# Patient Record
Sex: Female | Born: 1940 | Race: White | Hispanic: No | Marital: Married | Smoking: Never smoker
Health system: Southern US, Community
[De-identification: ages and names within clinical notes are randomized; demographics above are authoritative.]

## PROBLEM LIST (undated history)

## (undated) DIAGNOSIS — K219 Gastro-esophageal reflux disease without esophagitis: Secondary | ICD-10-CM

## (undated) DIAGNOSIS — I10 Essential (primary) hypertension: Secondary | ICD-10-CM

## (undated) DIAGNOSIS — M8580 Other specified disorders of bone density and structure, unspecified site: Secondary | ICD-10-CM

## (undated) DIAGNOSIS — M1990 Unspecified osteoarthritis, unspecified site: Secondary | ICD-10-CM

## (undated) HISTORY — DX: Essential (primary) hypertension: I10

## (undated) HISTORY — PX: APPENDECTOMY: SHX54

## (undated) HISTORY — DX: Gastro-esophageal reflux disease without esophagitis: K21.9

## (undated) HISTORY — DX: Unspecified osteoarthritis, unspecified site: M19.90

## (undated) HISTORY — DX: Other specified disorders of bone density and structure, unspecified site: M85.80

## (undated) HISTORY — PX: SHOULDER SURGERY: SHX246

## (undated) HISTORY — PX: WRIST SURGERY: SHX841

## (undated) HISTORY — PX: TONSILLECTOMY: SUR1361

---

## 2001-03-05 ENCOUNTER — Encounter: Payer: Self-pay | Admitting: Unknown Physician Specialty

## 2001-03-05 ENCOUNTER — Ambulatory Visit (HOSPITAL_COMMUNITY): Admission: RE | Admit: 2001-03-05 | Discharge: 2001-03-05 | Payer: Self-pay | Admitting: Unknown Physician Specialty

## 2017-04-26 DIAGNOSIS — Z01818 Encounter for other preprocedural examination: Secondary | ICD-10-CM | POA: Diagnosis not present

## 2018-10-01 DIAGNOSIS — R918 Other nonspecific abnormal finding of lung field: Secondary | ICD-10-CM

## 2018-10-18 ENCOUNTER — Institutional Professional Consult (permissible substitution): Payer: Medicare Other | Admitting: Emergency Medicine

## 2018-10-18 ENCOUNTER — Encounter: Payer: Self-pay | Admitting: *Deleted

## 2018-10-18 ENCOUNTER — Other Ambulatory Visit: Payer: Self-pay | Admitting: *Deleted

## 2018-10-18 ENCOUNTER — Telehealth: Payer: Self-pay | Admitting: Acute Care

## 2018-10-18 NOTE — Progress Notes (Signed)
Oncology Nurse Navigator Documentation  Oncology Nurse Navigator Flowsheets 10/18/2018  Navigator Location [facility]  Navigator Encounter Type Other/per cancer conference 10/18/2018, recommendation is for tissue DX with EBUS.  Patient is set up to see Dr. Lamonte Sakai 10/19/2018.    Barriers/Navigation Needs Coordination of Care  Interventions Coordination of Care  Coordination of Care Other  Acuity Level 1  Time Spent with Patient 15

## 2018-10-18 NOTE — Progress Notes (Signed)
The proposed treatment discussed at cancer conference 10/18/2018 is for discussion purpose only and is not a binding recommendation.  The patient was not physically examined nor present for their treatment options.  Therefore, final treatment plans cannot be decided.

## 2018-10-18 NOTE — Telephone Encounter (Signed)
I have spoken to Dr. Margarette Canada nurse  Ezra Sites at [facility]. Ms. Mcintire is Dr. Margarette Canada Primary care patient. This patient was presented at Thoracic conference this morning.She has PET scan suspicious for bronchogenic cancer with suspicion of metastatic disease. She was placed on the thoracic conference roster by Dr. Anselm Pancoast from IR. Recommendation of the group was for bronch/ EBUS for tissue sampling. PET was done in [facility] through [employer] I was asked  to make sure the patient had appropriate follow up. I called the patient's PCP to ask them to ensure the patient had appropriate oncology follow up and to let them know if the patient needed referral for the Bronch / Ebus we could provide that service if the patient was not already plugged in to a group who is managing her follow up care and diagnostic work up.  [place] stated that she will pass the information on to Dr. Gloriann Loan to ensure the patient had appropriate follow up/ tissue sampling.    10/04/2018 PET Imaging Base of Skull to Thigh 2.6 cm right lower lobe lesion is hypermetabolic and most consistent with primary lung neoplasm. 2. Multiple pulmonary nodules suspicious for metastatic disease. 3. Mediastinal and hilar lymph nodes. The only hypermetabolic lesion is the subcarinal node on the right side. 4. No findings for abdominal/pelvic metastatic disease or osseous metastatic disease.

## 2018-10-19 ENCOUNTER — Ambulatory Visit (INDEPENDENT_AMBULATORY_CARE_PROVIDER_SITE_OTHER): Payer: Medicare Other | Admitting: Emergency Medicine

## 2018-10-19 ENCOUNTER — Encounter: Payer: Self-pay | Admitting: Emergency Medicine

## 2018-10-19 VITALS — BP 140/82 | HR 82 | Ht 65.0 in | Wt <= 1120 oz

## 2018-10-19 DIAGNOSIS — R05 Cough: Secondary | ICD-10-CM

## 2018-10-19 DIAGNOSIS — R9389 Abnormal findings on diagnostic imaging of other specified body structures: Secondary | ICD-10-CM | POA: Diagnosis not present

## 2018-10-19 DIAGNOSIS — R918 Other nonspecific abnormal finding of lung field: Secondary | ICD-10-CM | POA: Diagnosis not present

## 2018-10-19 DIAGNOSIS — R059 Cough, unspecified: Secondary | ICD-10-CM

## 2018-10-19 NOTE — Assessment & Plan Note (Signed)
The cough actually prompted chest x-rays and then subsequent imaging.  It started after she underwent general anesthesia for shoulder surgery and had upper airway, throat irritation from the endotracheal tube.  She is continued to have cough, has been noted to have hiatal hernia (on a PPI) also on an ACE inhibitor.  May need to modify both of these going forward.

## 2018-10-19 NOTE — Assessment & Plan Note (Signed)
Bilateral pulmonary nodules most notable in the right with some evidence of hypermetabolism on PET scan.  She also has mediastinal lymphadenopathy with some mild hypermetabolism in the right subcarinal area.  At risk for primary lung cancer versus opportunistic infection.  She is a never smoker but does have a family history for lung malignancy.  I believe she needs diagnostics soon as we can arrange.  I recommended bronchoscopy with navigation to her pulmonary nodules, endobronchial ultrasound to approach the subcarinal node.  She understands the risks and benefits and agrees.  I will schedule soon as possible.

## 2018-10-19 NOTE — Patient Instructions (Addendum)
We will set up bronchoscopy under general anesthesia to better evaluate your pulmonary nodules and your enlarged lymph nodes in the chest.  We will get this done on either 3/11 or 3/18.  We will update you on the scheduling. We will repeat your CT scan of the chest to help facilitate the bronchoscopy. Continue your omeprazole 20 mg as you have been taking it. We may decide to consider changing your lisinopril at some point in the future as it can contribute to cough. Follow with Dr Lamonte Sakai in 1 month

## 2018-10-19 NOTE — H&P (View-Only) (Signed)
Subjective:    Patient ID: Erika Shah, female    DOB: 25-Dec-1940, 78 y.o.   MRN: [id number]  HPI  78 year old never smoker with a history of GERD, hypertension on an ACE inhibitor, osteoarthritis.  She was discussed at thoracic conference on 3/5 for an abnormal CT scan of the chest and PET scan.  She was initially referred by Dr. Elisabeth Cara to interventional radiology for possible biopsy. The imaging was done when she developed dry cough. No hemoptysis. She has some low level R pleuritic pain with cough. Has a hiatal hernia with GERD.   PET 10/04/2018 reviewed by me, shows a 2.6 cm right lower lobe hypermetabolic nodule, multiple other pulmonary nodules suspicious for metastatic disease, mediastinal and hilar lymphadenopathy.  The only node that is hypermetabolic is in the subcarinal region.  No evidence of distant metastatic disease.  Family hx lung CA in her dad (smoker)  Review of Systems  Constitutional: Negative for fever and unexpected weight change.  HENT: Negative for congestion, dental problem, ear pain, nosebleeds, postnasal drip, rhinorrhea, sinus pressure, sneezing, sore throat and trouble swallowing.   Eyes: Negative for redness and itching.  Respiratory: Positive for cough. Negative for chest tightness, shortness of breath and wheezing.   Cardiovascular: Negative for palpitations and leg swelling.  Gastrointestinal: Negative for nausea and vomiting.  Genitourinary: Negative for dysuria.  Musculoskeletal: Negative for joint swelling.  Skin: Negative for rash.  Neurological: Negative for headaches.  Hematological: Does not bruise/bleed easily.  Psychiatric/Behavioral: Negative for dysphoric mood. The patient is not nervous/anxious.    Past Medical History:  Diagnosis Date  . GERD (gastroesophageal reflux disease)   . Hypertension   . Osteoarthritis   . Osteopenia      No family history on file.   Social History   Socioeconomic History  . Marital status:  Married    Spouse name: Not on file  . Number of children: Not on file  . Years of education: Not on file  . Highest education level: Not on file  Occupational History  . Not on file  Social Needs  . Financial resource strain: Not on file  . Food insecurity:    Worry: Not on file    Inability: Not on file  . Transportation needs:    Medical: Not on file    Non-medical: Not on file  Tobacco Use  . Smoking status: Never Smoker  . Smokeless tobacco: Never Used  Substance and Sexual Activity  . Alcohol use: Not on file  . Drug use: Not on file  . Sexual activity: Not on file  Lifestyle  . Physical activity:    Days per week: Not on file    Minutes per session: Not on file  . Stress: Not on file  Relationships  . Social connections:    Talks on phone: Not on file    Gets together: Not on file    Attends religious service: Not on file    Active member of club or organization: Not on file    Attends meetings of clubs or organizations: Not on file    Relationship status: Not on file  . Intimate partner violence:    Fear of current or ex partner: Not on file    Emotionally abused: Not on file    Physically abused: Not on file    Forced sexual activity: Not on file  Other Topics Concern  . Not on file  Social History Narrative  . Not on file  No Known Allergies   Outpatient Medications Prior to Visit  Medication Sig Dispense Refill  . lisinopril-hydrochlorothiazide (PRINZIDE,ZESTORETIC) 10-12.5 MG tablet Take 1 tablet by mouth daily.    Marland Kitchen omeprazole (PRILOSEC) 20 MG capsule Take 20 mg by mouth daily.     No facility-administered medications prior to visit.         Objective:   Physical Exam Vitals:   10/19/18 1133  BP: 140/82  Pulse: 82  SpO2: 92%  Weight: 19 lb (8.618 kg)  Height: 5\' 5"  (1.651 m)   Gen: Pleasant, well-nourished, in no distress,  normal affect  ENT: No lesions,  mouth clear,  oropharynx clear, no postnasal drip, very decreased hearing   Neck: No JVD, no stridor  Lungs: No use of accessory muscles, no crackles or wheezing on normal respiration, no wheeze on forced expiration  Cardiovascular: RRR, heart sounds normal, no murmur or gallops, no peripheral edema  Musculoskeletal: No deformities, no cyanosis or clubbing  Neuro: alert, awake, non focal  Skin: Warm, no lesions or rash       Assessment & Plan:  Cough The cough actually prompted chest x-rays and then subsequent imaging.  It started after she underwent general anesthesia for shoulder surgery and had upper airway, throat irritation from the endotracheal tube.  She is continued to have cough, has been noted to have hiatal hernia (on a PPI) also on an ACE inhibitor.  May need to modify both of these going forward.  Abnormal CT of the chest Bilateral pulmonary nodules most notable in the right with some evidence of hypermetabolism on PET scan.  She also has mediastinal lymphadenopathy with some mild hypermetabolism in the right subcarinal area.  At risk for primary lung cancer versus opportunistic infection.  She is a never smoker but does have a family history for lung malignancy.  I believe she needs diagnostics soon as we can arrange.  I recommended bronchoscopy with navigation to her pulmonary nodules, endobronchial ultrasound to approach the subcarinal node.  She understands the risks and benefits and agrees.  I will schedule soon as possible.  Baltazar Apo, MD, PhD 10/19/2018, 1:14 PM [facility] [phone] or if no answer [phone]

## 2018-10-19 NOTE — Progress Notes (Signed)
Subjective:    Patient ID: Erika Shah, female    DOB: 1941-01-19, 78 y.o.   MRN: [id number]  HPI  78 year old never smoker with a history of GERD, hypertension on an ACE inhibitor, osteoarthritis.  She was discussed at thoracic conference on 3/5 for an abnormal CT scan of the chest and PET scan.  She was initially referred by Dr. Elisabeth Cara to interventional radiology for possible biopsy. The imaging was done when she developed dry cough. No hemoptysis. She has some low level R pleuritic pain with cough. Has a hiatal hernia with GERD.   PET 10/04/2018 reviewed by me, shows a 2.6 cm right lower lobe hypermetabolic nodule, multiple other pulmonary nodules suspicious for metastatic disease, mediastinal and hilar lymphadenopathy.  The only node that is hypermetabolic is in the subcarinal region.  No evidence of distant metastatic disease.  Family hx lung CA in her dad (smoker)  Review of Systems  Constitutional: Negative for fever and unexpected weight change.  HENT: Negative for congestion, dental problem, ear pain, nosebleeds, postnasal drip, rhinorrhea, sinus pressure, sneezing, sore throat and trouble swallowing.   Eyes: Negative for redness and itching.  Respiratory: Positive for cough. Negative for chest tightness, shortness of breath and wheezing.   Cardiovascular: Negative for palpitations and leg swelling.  Gastrointestinal: Negative for nausea and vomiting.  Genitourinary: Negative for dysuria.  Musculoskeletal: Negative for joint swelling.  Skin: Negative for rash.  Neurological: Negative for headaches.  Hematological: Does not bruise/bleed easily.  Psychiatric/Behavioral: Negative for dysphoric mood. The patient is not nervous/anxious.    Past Medical History:  Diagnosis Date  . GERD (gastroesophageal reflux disease)   . Hypertension   . Osteoarthritis   . Osteopenia      No family history on file.   Social History   Socioeconomic History  . Marital status:  Married    Spouse name: Not on file  . Number of children: Not on file  . Years of education: Not on file  . Highest education level: Not on file  Occupational History  . Not on file  Social Needs  . Financial resource strain: Not on file  . Food insecurity:    Worry: Not on file    Inability: Not on file  . Transportation needs:    Medical: Not on file    Non-medical: Not on file  Tobacco Use  . Smoking status: Never Smoker  . Smokeless tobacco: Never Used  Substance and Sexual Activity  . Alcohol use: Not on file  . Drug use: Not on file  . Sexual activity: Not on file  Lifestyle  . Physical activity:    Days per week: Not on file    Minutes per session: Not on file  . Stress: Not on file  Relationships  . Social connections:    Talks on phone: Not on file    Gets together: Not on file    Attends religious service: Not on file    Active member of club or organization: Not on file    Attends meetings of clubs or organizations: Not on file    Relationship status: Not on file  . Intimate partner violence:    Fear of current or ex partner: Not on file    Emotionally abused: Not on file    Physically abused: Not on file    Forced sexual activity: Not on file  Other Topics Concern  . Not on file  Social History Narrative  . Not on file  No Known Allergies   Outpatient Medications Prior to Visit  Medication Sig Dispense Refill  . lisinopril-hydrochlorothiazide (PRINZIDE,ZESTORETIC) 10-12.5 MG tablet Take 1 tablet by mouth daily.    Marland Kitchen omeprazole (PRILOSEC) 20 MG capsule Take 20 mg by mouth daily.     No facility-administered medications prior to visit.         Objective:   Physical Exam Vitals:   10/19/18 1133  BP: 140/82  Pulse: 82  SpO2: 92%  Weight: 19 lb (8.618 kg)  Height: 5\' 5"  (1.651 m)   Gen: Pleasant, well-nourished, in no distress,  normal affect  ENT: No lesions,  mouth clear,  oropharynx clear, no postnasal drip, very decreased  hearing  Neck: No JVD, no stridor  Lungs: No use of accessory muscles, no crackles or wheezing on normal respiration, no wheeze on forced expiration  Cardiovascular: RRR, heart sounds normal, no murmur or gallops, no peripheral edema  Musculoskeletal: No deformities, no cyanosis or clubbing  Neuro: alert, awake, non focal  Skin: Warm, no lesions or rash       Assessment & Plan:  Cough The cough actually prompted chest x-rays and then subsequent imaging.  It started after she underwent general anesthesia for shoulder surgery and had upper airway, throat irritation from the endotracheal tube.  She is continued to have cough, has been noted to have hiatal hernia (on a PPI) also on an ACE inhibitor.  May need to modify both of these going forward.  Abnormal CT of the chest Bilateral pulmonary nodules most notable in the right with some evidence of hypermetabolism on PET scan.  She also has mediastinal lymphadenopathy with some mild hypermetabolism in the right subcarinal area.  At risk for primary lung cancer versus opportunistic infection.  She is a never smoker but does have a family history for lung malignancy.  I believe she needs diagnostics soon as we can arrange.  I recommended bronchoscopy with navigation to her pulmonary nodules, endobronchial ultrasound to approach the subcarinal node.  She understands the risks and benefits and agrees.  I will schedule soon as possible.  Baltazar Apo, MD, PhD 10/19/2018, 1:14 PM [facility] [phone] or if no answer [phone]

## 2018-10-22 ENCOUNTER — Other Ambulatory Visit: Payer: Self-pay

## 2018-10-22 ENCOUNTER — Ambulatory Visit (INDEPENDENT_AMBULATORY_CARE_PROVIDER_SITE_OTHER)
Admission: RE | Admit: 2018-10-22 | Discharge: 2018-10-22 | Disposition: A | Discharge status: Home / Self Care | Payer: Medicare Other | Source: Ambulatory Visit | Attending: Emergency Medicine | Admitting: Emergency Medicine

## 2018-10-22 ENCOUNTER — Encounter (HOSPITAL_COMMUNITY): Payer: Self-pay | Admitting: *Deleted

## 2018-10-22 DIAGNOSIS — R918 Other nonspecific abnormal finding of lung field: Secondary | ICD-10-CM

## 2018-10-22 NOTE — Progress Notes (Addendum)
Ms Leavelle asked me to speak to her daughter Avi Archuleta. PCP is Dr. Elisabeth Cara in [place].  Patient reports that her last EKG was in 2018 when she had a shoulder replacement.  Gustavia Carie reports that Princess Karnes has not seen a cardiologist, no ECHO, Stress Test or cardiac cat has been done on patient.

## 2018-10-24 ENCOUNTER — Ambulatory Visit (HOSPITAL_COMMUNITY): Payer: Medicare Other | Admitting: Certified Registered Nurse Anesthetist

## 2018-10-24 ENCOUNTER — Ambulatory Visit (HOSPITAL_COMMUNITY): Payer: Medicare Other

## 2018-10-24 ENCOUNTER — Ambulatory Visit (HOSPITAL_COMMUNITY)
Admission: RE | Admit: 2018-10-24 | Discharge: 2018-10-24 | Disposition: A | Discharge status: Home / Self Care | Payer: Medicare Other | Attending: Emergency Medicine | Admitting: Emergency Medicine

## 2018-10-24 ENCOUNTER — Encounter (HOSPITAL_COMMUNITY): Payer: Self-pay | Admitting: Certified Registered Nurse Anesthetist

## 2018-10-24 ENCOUNTER — Other Ambulatory Visit: Payer: Self-pay

## 2018-10-24 ENCOUNTER — Encounter (HOSPITAL_COMMUNITY)
Admission: RE | Disposition: A | Discharge status: Home / Self Care | Payer: Self-pay | Source: Home / Self Care | Attending: Emergency Medicine

## 2018-10-24 DIAGNOSIS — Z801 Family history of malignant neoplasm of trachea, bronchus and lung: Secondary | ICD-10-CM | POA: Diagnosis not present

## 2018-10-24 DIAGNOSIS — Z6832 Body mass index (BMI) 32.0-32.9, adult: Secondary | ICD-10-CM | POA: Diagnosis not present

## 2018-10-24 DIAGNOSIS — M8580 Other specified disorders of bone density and structure, unspecified site: Secondary | ICD-10-CM | POA: Diagnosis not present

## 2018-10-24 DIAGNOSIS — E669 Obesity, unspecified: Secondary | ICD-10-CM | POA: Insufficient documentation

## 2018-10-24 DIAGNOSIS — R9389 Abnormal findings on diagnostic imaging of other specified body structures: Secondary | ICD-10-CM | POA: Diagnosis not present

## 2018-10-24 DIAGNOSIS — K219 Gastro-esophageal reflux disease without esophagitis: Secondary | ICD-10-CM | POA: Insufficient documentation

## 2018-10-24 DIAGNOSIS — Z87891 Personal history of nicotine dependence: Secondary | ICD-10-CM | POA: Insufficient documentation

## 2018-10-24 DIAGNOSIS — M1990 Unspecified osteoarthritis, unspecified site: Secondary | ICD-10-CM | POA: Diagnosis not present

## 2018-10-24 DIAGNOSIS — R590 Localized enlarged lymph nodes: Secondary | ICD-10-CM | POA: Insufficient documentation

## 2018-10-24 DIAGNOSIS — R918 Other nonspecific abnormal finding of lung field: Secondary | ICD-10-CM | POA: Diagnosis not present

## 2018-10-24 DIAGNOSIS — K449 Diaphragmatic hernia without obstruction or gangrene: Secondary | ICD-10-CM | POA: Insufficient documentation

## 2018-10-24 DIAGNOSIS — I10 Essential (primary) hypertension: Secondary | ICD-10-CM | POA: Insufficient documentation

## 2018-10-24 DIAGNOSIS — Z9889 Other specified postprocedural states: Secondary | ICD-10-CM

## 2018-10-24 DIAGNOSIS — Z419 Encounter for procedure for purposes other than remedying health state, unspecified: Secondary | ICD-10-CM

## 2018-10-24 DIAGNOSIS — Z79899 Other long term (current) drug therapy: Secondary | ICD-10-CM | POA: Diagnosis not present

## 2018-10-24 HISTORY — PX: VIDEO BRONCHOSCOPY WITH ENDOBRONCHIAL NAVIGATION: SHX6175

## 2018-10-24 HISTORY — PX: FUDUCIAL PLACEMENT: SHX5083

## 2018-10-24 HISTORY — PX: VIDEO BRONCHOSCOPY WITH ENDOBRONCHIAL ULTRASOUND: SHX6177

## 2018-10-24 LAB — CBC
HCT: 40 % (ref 36.0–46.0)
Hemoglobin: 12.4 g/dL (ref 12.0–15.0)
MCH: 29.9 pg (ref 26.0–34.0)
MCHC: 31 g/dL (ref 30.0–36.0)
MCV: 96.4 fL (ref 80.0–100.0)
Platelets: 241 10*3/uL (ref 150–400)
RBC: 4.15 MIL/uL (ref 3.87–5.11)
RDW: 12.7 % (ref 11.5–15.5)
WBC: 7 10*3/uL (ref 4.0–10.5)
nRBC: 0 % (ref 0.0–0.2)

## 2018-10-24 LAB — COMPREHENSIVE METABOLIC PANEL
ALT: 15 U/L (ref 0–44)
AST: 19 U/L (ref 15–41)
Albumin: 3.6 g/dL (ref 3.5–5.0)
Alkaline Phosphatase: 66 U/L (ref 38–126)
Anion gap: 11 (ref 5–15)
BUN: 7 mg/dL — ABNORMAL LOW (ref 8–23)
CO2: 25 mmol/L (ref 22–32)
CREATININE: 0.88 mg/dL (ref 0.44–1.00)
Calcium: 9.2 mg/dL (ref 8.9–10.3)
Chloride: 104 mmol/L (ref 98–111)
GFR calc Af Amer: >60 mL/min (ref >60)
GFR calc non Af Amer: >60 mL/min (ref >60)
Glucose, Bld: 108 mg/dL — ABNORMAL HIGH (ref 70–99)
Potassium: 4.1 mmol/L (ref 3.5–5.1)
Sodium: 140 mmol/L (ref 135–145)
Total Bilirubin: 0.5 mg/dL (ref 0.3–1.2)
Total Protein: 6.5 g/dL (ref 6.5–8.1)

## 2018-10-24 LAB — APTT: APTT: 36 s (ref 24–36)

## 2018-10-24 LAB — PROTIME-INR
INR: 1 (ref 0.8–1.2)
Prothrombin Time: 12.7 seconds (ref 11.4–15.2)

## 2018-10-24 SURGERY — VIDEO BRONCHOSCOPY WITH ENDOBRONCHIAL NAVIGATION
Anesthesia: General | Site: Chest | Laterality: Right

## 2018-10-24 MED ORDER — 0.9 % SODIUM CHLORIDE (POUR BTL) OPTIME
TOPICAL | Status: DC | PRN
  Administered 2018-10-24: 1000 mL

## 2018-10-24 MED ORDER — ROCURONIUM BROMIDE 50 MG/5ML IV SOSY
PREFILLED_SYRINGE | INTRAVENOUS | Status: AC
  Filled 2018-10-24: qty 5

## 2018-10-24 MED ORDER — PROPOFOL 10 MG/ML IV BOLUS
INTRAVENOUS | Status: AC
  Filled 2018-10-24: qty 20

## 2018-10-24 MED ORDER — PHENYLEPHRINE HCL 10 MG/ML IJ SOLN
INTRAMUSCULAR | Status: DC | PRN
  Administered 2018-10-24: 120 ug via INTRAVENOUS
  Administered 2018-10-24: 80 ug via INTRAVENOUS
  Administered 2018-10-24: 120 ug via INTRAVENOUS

## 2018-10-24 MED ORDER — FENTANYL CITRATE (PF) 100 MCG/2ML IJ SOLN: 25.0000 ug | INTRAMUSCULAR | Status: DC | PRN

## 2018-10-24 MED ORDER — SUGAMMADEX SODIUM 200 MG/2ML IV SOLN
INTRAVENOUS | Status: DC | PRN
  Administered 2018-10-24: 200 mg via INTRAVENOUS

## 2018-10-24 MED ORDER — OXYCODONE HCL 5 MG/5ML PO SOLN: 5.0000 mg | Freq: Once | ORAL | Status: DC | PRN

## 2018-10-24 MED ORDER — OXYCODONE HCL 5 MG PO TABS: 5.0000 mg | ORAL_TABLET | Freq: Once | ORAL | Status: DC | PRN

## 2018-10-24 MED ORDER — ONDANSETRON HCL 4 MG/2ML IJ SOLN: 4.0000 mg | Freq: Once | INTRAMUSCULAR | Status: DC | PRN

## 2018-10-24 MED ORDER — PROPOFOL 10 MG/ML IV BOLUS
INTRAVENOUS | Status: DC | PRN
  Administered 2018-10-24 (×2): 30 mg via INTRAVENOUS
  Administered 2018-10-24: 50 mg via INTRAVENOUS
  Administered 2018-10-24: 120 mg via INTRAVENOUS

## 2018-10-24 MED ORDER — ONDANSETRON HCL 4 MG/2ML IJ SOLN
INTRAMUSCULAR | Status: AC
  Filled 2018-10-24: qty 2

## 2018-10-24 MED ORDER — LIDOCAINE 2% (20 MG/ML) 5 ML SYRINGE
INTRAMUSCULAR | Status: AC
  Filled 2018-10-24: qty 10

## 2018-10-24 MED ORDER — FENTANYL CITRATE (PF) 250 MCG/5ML IJ SOLN
INTRAMUSCULAR | Status: AC
  Filled 2018-10-24: qty 5

## 2018-10-24 MED ORDER — FENTANYL CITRATE (PF) 100 MCG/2ML IJ SOLN
INTRAMUSCULAR | Status: DC | PRN
  Administered 2018-10-24 (×2): 50 ug via INTRAVENOUS

## 2018-10-24 MED ORDER — LACTATED RINGERS IV SOLN
INTRAVENOUS | Status: DC
  Administered 2018-10-24 (×2): via INTRAVENOUS

## 2018-10-24 MED ORDER — ONDANSETRON HCL 4 MG/2ML IJ SOLN
INTRAMUSCULAR | Status: DC | PRN
  Administered 2018-10-24: 4 mg via INTRAVENOUS

## 2018-10-24 MED ORDER — LIDOCAINE 2% (20 MG/ML) 5 ML SYRINGE
INTRAMUSCULAR | Status: DC | PRN
  Administered 2018-10-24: 40 mg via INTRAVENOUS

## 2018-10-24 MED ORDER — LIDOCAINE 2% (20 MG/ML) 5 ML SYRINGE
INTRAMUSCULAR | Status: AC
  Filled 2018-10-24: qty 5

## 2018-10-24 MED ORDER — DEXAMETHASONE SODIUM PHOSPHATE 10 MG/ML IJ SOLN
INTRAMUSCULAR | Status: DC | PRN
  Administered 2018-10-24: 10 mg via INTRAVENOUS

## 2018-10-24 MED ORDER — DEXAMETHASONE SODIUM PHOSPHATE 10 MG/ML IJ SOLN
INTRAMUSCULAR | Status: AC
  Filled 2018-10-24: qty 1

## 2018-10-24 MED ORDER — ROCURONIUM BROMIDE 50 MG/5ML IV SOSY
PREFILLED_SYRINGE | INTRAVENOUS | Status: DC | PRN
  Administered 2018-10-24: 50 mg via INTRAVENOUS
  Administered 2018-10-24: 10 mg via INTRAVENOUS

## 2018-10-24 MED ORDER — PHENYLEPHRINE 40 MCG/ML (10ML) SYRINGE FOR IV PUSH (FOR BLOOD PRESSURE SUPPORT)
PREFILLED_SYRINGE | INTRAVENOUS | Status: AC
  Filled 2018-10-24: qty 10

## 2018-10-24 SURGICAL SUPPLY — 51 items
ADAPTER BRONCHOSCOPE OLYMPUS (ADAPTER) ×3 IMPLANT
ADAPTER VALVE BIOPSY EBUS (MISCELLANEOUS) IMPLANT
ADPR BSCP OLMPS EDG (ADAPTER) ×2
ADPTR VALVE BIOPSY EBUS (MISCELLANEOUS)
BRUSH CYTOL CELLEBRITY 1.5X140 (MISCELLANEOUS) ×3 IMPLANT
BRUSH SUPERTRAX BIOPSY (INSTRUMENTS) IMPLANT
BRUSH SUPERTRAX NDL-TIP CYTO (INSTRUMENTS) ×3 IMPLANT
CANISTER SUCT 3000ML PPV (MISCELLANEOUS) ×3 IMPLANT
CHANNEL WORK EXTEND EDGE 180 (KITS) ×1 IMPLANT
CHANNEL WORK EXTEND EDGE 90 (KITS) IMPLANT
CONT SPEC 4OZ CLIKSEAL STRL BL (MISCELLANEOUS) ×4 IMPLANT
COVER BACK TABLE 60X90IN (DRAPES) ×3 IMPLANT
COVER WAND RF STERILE (DRAPES) ×2 IMPLANT
FILTER STRAW FLUID ASPIR (MISCELLANEOUS) IMPLANT
FORCEPS BIOP RJ4 1.8 (CUTTING FORCEPS) IMPLANT
FORCEPS BIOP SUPERTRX PREMAR (INSTRUMENTS) ×3 IMPLANT
GAUZE SPONGE 4X4 12PLY STRL (GAUZE/BANDAGES/DRESSINGS) ×3 IMPLANT
GLOVE BIO SURGEON STRL SZ7.5 (GLOVE) ×7 IMPLANT
GOWN STRL REUS W/ TWL LRG LVL3 (GOWN DISPOSABLE) ×4 IMPLANT
GOWN STRL REUS W/TWL LRG LVL3 (GOWN DISPOSABLE) ×6
KIT CLEAN ENDO COMPLIANCE (KITS) ×6 IMPLANT
KIT ENDOBRONCHIAL EDGE FIRM (KITS) ×1 IMPLANT
KIT LOCATABLE GUIDE (CANNULA) IMPLANT
KIT MARKER FIDUCIAL DELIVERY (KITS) ×1 IMPLANT
KIT PROCEDURE EDGE 180 (KITS) IMPLANT
KIT PROCEDURE EDGE 90 (KITS) IMPLANT
KIT TURNOVER KIT B (KITS) ×3 IMPLANT
MARKER FIDUCIAL SL NIT COIL (Implant Marker) ×3 IMPLANT
MARKER SKIN DUAL TIP RULER LAB (MISCELLANEOUS) ×3 IMPLANT
NDL ASPIRATION VIZISHOT 19G (NEEDLE) IMPLANT
NDL ASPIRATION VIZISHOT 21G (NEEDLE) IMPLANT
NDL SUPERTRX PREMARK BIOPSY (NEEDLE) ×2 IMPLANT
NEEDLE ASPIRATION VIZISHOT 19G (NEEDLE) ×3 IMPLANT
NEEDLE ASPIRATION VIZISHOT 21G (NEEDLE) IMPLANT
NEEDLE SUPERTRX PREMARK BIOPSY (NEEDLE) ×3 IMPLANT
NS IRRIG 1000ML POUR BTL (IV SOLUTION) ×3 IMPLANT
OIL SILICONE PENTAX (PARTS (SERVICE/REPAIRS)) ×3 IMPLANT
PAD ARMBOARD 7.5X6 YLW CONV (MISCELLANEOUS) ×6 IMPLANT
PATCHES PATIENT (LABEL) ×9 IMPLANT
SYR 20CC LL (SYRINGE) ×6 IMPLANT
SYR 20ML ECCENTRIC (SYRINGE) ×6 IMPLANT
SYR 50ML SLIP (SYRINGE) ×3 IMPLANT
SYR 5ML LUER SLIP (SYRINGE) ×3 IMPLANT
TOWEL OR 17X24 6PK STRL BLUE (TOWEL DISPOSABLE) ×3 IMPLANT
TRAP SPECIMEN MUCOUS 40CC (MISCELLANEOUS) IMPLANT
TUBE CONNECTING 20X1/4 (TUBING) ×6 IMPLANT
UNDERPAD 30X30 (UNDERPADS AND DIAPERS) ×3 IMPLANT
VALVE BIOPSY  SINGLE USE (MISCELLANEOUS) ×1
VALVE BIOPSY SINGLE USE (MISCELLANEOUS) ×2 IMPLANT
VALVE SUCTION BRONCHIO DISP (MISCELLANEOUS) ×3 IMPLANT
WATER STERILE IRR 1000ML POUR (IV SOLUTION) ×3 IMPLANT

## 2018-10-24 NOTE — Op Note (Signed)
Video Bronchoscopy with Endobronchial Ultrasound and Electromagnetic Navigation Procedure Note  Date of Operation: 10/24/2018  Pre-op Diagnosis: Pulmonary nodules, mediastinal lymphadenopathy  Post-op Diagnosis: Same  Surgeon: Baltazar Apo  Assistants: None  Anesthesia: General endotracheal anesthesia  Operation: Flexible video fiberoptic bronchoscopy with endobronchial ultrasound and biopsies.  Estimated Blood Loss: Minimal  Complications: None apparent  Indications and History: Erika Shah is a 78 y.o. female with history tobacco use.  She was found to have multiple pulmonary nodules including a predominant spiculated 2 cm right upper lobe nodule.  Also seen were enlarged mediastinal lymph nodes.  Recommendation was made to achieve a tissue diagnosis via navigational bronchoscopy and endobronchial ultrasound.  The risks, benefits, complications, treatment options and expected outcomes were discussed with the patient.  The possibilities of pneumothorax, pneumonia, reaction to medication, pulmonary aspiration, perforation of a viscus, bleeding, failure to diagnose a condition and creating a complication requiring transfusion or operation were discussed with the patient who freely signed the consent.    Description of Procedure: The patient was examined in the preoperative area and history and data from the preprocedure consultation were reviewed. It was deemed appropriate to proceed.  The patient was taken to OR 10, identified as Erika Shah and the procedure verified as Flexible Video Fiberoptic Bronchoscopy.  A Time Out was held and the above information confirmed. After being taken to the operating room general anesthesia was initiated and the patient  was orally intubated. The video fiberoptic bronchoscope was introduced via the endotracheal tube and a general inspection was performed which showed normal airways throughout.   Prior to the date of the procedure a  high-resolution CT scan of the chest was performed. Utilizing Ulen a virtual tracheobronchial tree was generated to allow the creation of distinct navigation pathways to the patient's parenchymal abnormalities.  The extendable working channel and locator guide were introduced into the bronchoscope. The distinct navigation pathways prepared prior to this procedure were then utilized to navigate to within 0.4cm of patient's right upper lobe nodule identified on CT scan. The extendable working channel was secured into place and the locator guide was withdrawn. Under fluoroscopic guidance transbronchial needle brushings and transbronchial forceps biopsies were performed to be sent for cytology and pathology. A bronchioalveolar lavage was performed in the right upper lobe and sent for cytology and microbiology (bacterial, fungal, AFB smears and cultures).  Finally 3 fiducial markers were placed triangulating the right upper lobe nodule should radiation therapy treatment become indicated in the future.    The standard scope was then withdrawn and the endobronchial ultrasound was used to identify and characterize the peritracheal, hilar and bronchial lymph nodes. Inspection showed enlargement at stations 4L, 4R, 7. Using real-time ultrasound guidance Wang needle biopsies were take from Station 4L, 4R, 7 nodes and were sent for cytology. The patient tolerated the procedure well without apparent complications. There was no significant blood loss. The bronchoscope was withdrawn. Anesthesia was reversed and the patient was taken to the PACU for recovery.  A post-procedural chest x-ray is pending  Samples: 1. Transbronchial needle brushings from right upper lobe nodule 2. Transbronchial forceps biopsies from right upper lobe nodule 3. Bronchoalveolar lavage from it upper lobe 4. Wang needle biopsies from 4L node 5. Wang needle biopsies from 4R node 6. Wang needle biopsies from 7 node   Plans:   The patient will be discharged from the PACU to home when recovered from anesthesia and after chest x-ray is reviewed. We will review the  cytology, pathology and microbiology results with the patient when they become available. Outpatient followup will be with Dr Lamonte Sakai.    Baltazar Apo, MD, PhD 10/24/2018, 2:42 PM [facility] [phone] or if no answer [phone]

## 2018-10-24 NOTE — Transfer of Care (Signed)
Immediate Anesthesia Transfer of Care Note  Patient: Erika Shah  Procedure(s) Performed: VIDEO BRONCHOSCOPY WITH ENDOBRONCHIAL NAVIGATION (N/A Chest) VIDEO BRONCHOSCOPY WITH ENDOBRONCHIAL ULTRASOUND (N/A Chest) PLACEMENT OF FUDUCIAL MARKERS x 3 (Right Chest)  Patient Location: PACU  Anesthesia Type:General  Level of Consciousness: awake and drowsy  Airway & Oxygen Therapy: Patient Spontanous Breathing and Patient connected to nasal cannula oxygen  Post-op Assessment: Report given to RN and Post -op Vital signs reviewed and stable  Post vital signs: Reviewed and stable  Last Vitals:  Vitals Value Taken Time  BP 133/68 10/24/2018  2:52 PM  Temp 36.1 C 10/24/2018  2:52 PM  Pulse 88 10/24/2018  2:57 PM  Resp 15 10/24/2018  2:57 PM  SpO2 93 % 10/24/2018  2:57 PM  Vitals shown include unvalidated device data.  Last Pain:  Vitals:   10/24/18 1005  TempSrc:   PainSc: 0-No pain         Complications: No apparent anesthesia complications

## 2018-10-24 NOTE — Anesthesia Preprocedure Evaluation (Addendum)
Anesthesia Evaluation  Patient identified by MRN, date of birth, ID band Patient awake    Reviewed: Allergy & Precautions, NPO status , Patient's Chart, lab work & pertinent test results  History of Anesthesia Complications Negative for: history of anesthetic complications  Airway Mallampati: II  TM Distance: >3 FB Neck ROM: Full    Dental  (+) Dental Advisory Given, Teeth Intact   Pulmonary   Lung mass    breath sounds clear to auscultation       Cardiovascular hypertension, Pt. on medications  Rhythm:Regular Rate:Normal     Neuro/Psych negative neurological ROS  negative psych ROS   GI/Hepatic Neg liver ROS, GERD  Medicated and Controlled,  Endo/Other   Obesity   Renal/GU negative Renal ROS     Musculoskeletal  (+) Arthritis , Osteoarthritis,    Abdominal   Peds  Hematology negative hematology ROS (+)   Anesthesia Other Findings   Reproductive/Obstetrics                            Anesthesia Physical Anesthesia Plan  ASA: III  Anesthesia Plan: General   Post-op Pain Management:    Induction: Intravenous  PONV Risk Score and Plan: 3 and Treatment may vary due to age or medical condition, Ondansetron and Dexamethasone  Airway Management Planned: Oral ETT  Additional Equipment: None  Intra-op Plan:   Post-operative Plan: Possible Post-op intubation/ventilation  Informed Consent: I have reviewed the patients History and Physical, chart, labs and discussed the procedure including the risks, benefits and alternatives for the proposed anesthesia with the patient or authorized representative who has indicated his/her understanding and acceptance.     Dental advisory given  Plan Discussed with: CRNA and Anesthesiologist  Anesthesia Plan Comments:        Anesthesia Quick Evaluation

## 2018-10-24 NOTE — Anesthesia Postprocedure Evaluation (Signed)
Anesthesia Post Note  Patient: Erika Shah  Procedure(s) Performed: VIDEO BRONCHOSCOPY WITH ENDOBRONCHIAL NAVIGATION (N/A Chest) VIDEO BRONCHOSCOPY WITH ENDOBRONCHIAL ULTRASOUND (N/A Chest) PLACEMENT OF FUDUCIAL MARKERS x 3 (Right Chest)     Patient location during evaluation: PACU Anesthesia Type: General Level of consciousness: awake and alert Pain management: pain level controlled Vital Signs Assessment: post-procedure vital signs reviewed and stable Respiratory status: spontaneous breathing, nonlabored ventilation and respiratory function stable Cardiovascular status: blood pressure returned to baseline and stable Postop Assessment: no apparent nausea or vomiting Anesthetic complications: no    Last Vitals:  Vitals:   10/24/18 1544 10/24/18 1554  BP: 119/62 (!) 144/72  Pulse: 84 87  Resp: 18 17  Temp: (!) 36.2 C   SpO2: 100% 100%    Last Pain:  Vitals:   10/24/18 1554  TempSrc:   PainSc: 0-No pain                 Audry Pili

## 2018-10-24 NOTE — Anesthesia Procedure Notes (Signed)
Procedure Name: Intubation Date/Time: 10/24/2018 12:39 PM Performed by: Inda Coke, CRNA Pre-anesthesia Checklist: Patient identified, Emergency Drugs available, Suction available and Patient being monitored Patient Re-evaluated:Patient Re-evaluated prior to induction Oxygen Delivery Method: Circle System Utilized Preoxygenation: Pre-oxygenation with 100% oxygen Induction Type: IV induction Ventilation: Mask ventilation without difficulty and Oral airway inserted - appropriate to patient size Laryngoscope Size: Mac and 3 Grade View: Grade I Tube type: Oral Tube size: 8.5 mm Number of attempts: 1 Airway Equipment and Method: Stylet and Oral airway Placement Confirmation: ETT inserted through vocal cords under direct vision,  positive ETCO2 and breath sounds checked- equal and bilateral Secured at: 21 cm Tube secured with: Tape Dental Injury: Teeth and Oropharynx as per pre-operative assessment

## 2018-10-24 NOTE — Interval H&P Note (Signed)
PCCM Interval Note  Patient presents today for further evaluation of her pulmonary nodule, mediastinal adenopathy. No new issues reported.  She does continue to have some dyspnea, difficulty taking a deep breath in.  This can be particularly bothersome when she is exerting, or bending. No new cough, chest pain, other respiratory symptoms  Vitals:   10/22/18 1851 10/24/18 0938  BP:  (!) 153/73  Pulse:  77  Resp:  18  Temp:  98.1 F (36.7 C)  TempSrc:  Oral  SpO2:  97%  Weight: 89.8 kg 89.8 kg  Height: 5\' 5"  (1.651 m) 5\' 5"  (1.651 m)   All labs reviewed  Plan for navigational bronchoscopy with transbronchial biopsies of right upper lobe nodule, endobronchial ultrasound with inspection of mediastinal lymphadenopathy and sampling if possible.  Patient understands the risk, benefits.  All questions answered.  She elected to proceed.  No barriers identified.  Baltazar Apo, MD, PhD 10/24/2018, 11:54 AM [facility] [phone] or if no answer [phone]

## 2018-10-24 NOTE — Discharge Instructions (Signed)
Flexible Bronchoscopy, Care After This sheet gives you information about how to care for yourself after your test. Your doctor may also give you more specific instructions. If you have problems or questions, contact your doctor. Follow these instructions at home: Eating and drinking  Do not eat or drink anything (not even water) for 2 hours after your test, or until your numbing medicine (local anesthetic) wears off.  When your numbness is gone and your cough and gag reflexes have come back, you may: ? Eat only soft foods. ? Slowly drink liquids.  The day after the test, go back to your normal diet. Driving  Do not drive for 24 hours if you were given a medicine to help you relax (sedative).  Do not drive or use heavy machinery while taking prescription pain medicine. General instructions   Take over-the-counter and prescription medicines only as told by your doctor.  Return to your normal activities as told. Ask what activities are safe for you.  Do not use any products that have nicotine or tobacco in them. This includes cigarettes and e-cigarettes. If you need help quitting, ask your doctor.  Keep all follow-up visits as told by your doctor. This is important. It is very important if you had a tissue sample (biopsy) taken. Get help right away if:  You have shortness of breath that gets worse.  You get light-headed.  You feel like you are going to pass out (faint).  You have chest pain.  You cough up: ? More than a little blood. ? More blood than before. Summary  Do not eat or drink anything (not even water) for 2 hours after your test, or until your numbing medicine wears off.  Do not use cigarettes. Do not use e-cigarettes.  Get help right away if you have chest pain.  Please call our office for any questions or concerns.  [phone].   This information is not intended to replace advice given to you by your health care provider. Make sure you discuss any  questions you have with your health care provider. Document Released: 05/29/2009 Document Revised: 08/19/2016 Document Reviewed: 08/19/2016 Elsevier Interactive Patient Education  2019 [employer].

## 2018-10-25 ENCOUNTER — Encounter (HOSPITAL_COMMUNITY): Payer: Self-pay | Admitting: Emergency Medicine

## 2018-10-25 LAB — ACID FAST SMEAR (AFB, MYCOBACTERIA): Acid Fast Smear: NEGATIVE

## 2018-10-26 ENCOUNTER — Telehealth: Payer: Self-pay | Admitting: Emergency Medicine

## 2018-10-26 NOTE — Telephone Encounter (Signed)
Please call the patient and let her know that the biopsies and brushings of her pulmonary nodule do not show any evidence for lung cancer.   The lymph node biopsies are not back yet. I will have to call her next week when those samples are finished.   Thanks.

## 2018-10-26 NOTE — Telephone Encounter (Signed)
Call made to patient daughter (regina) made aware of results per RB. Voiced understanding. She states she will await a call back next week from [facility]. Nothing further is needed at this time.

## 2018-10-30 ENCOUNTER — Telehealth: Payer: Self-pay | Admitting: Emergency Medicine

## 2018-10-30 DIAGNOSIS — C3490 Malignant neoplasm of unspecified part of unspecified bronchus or lung: Secondary | ICD-10-CM

## 2018-10-30 NOTE — Telephone Encounter (Signed)
I reviewed the results of the EBUS with [facility] today, unfortunately nodal bx shows NSCLCA, consistent w adenoCA. Not clear to me whether this is the cause of her cough, but clearly she needs to discuss rx with Oncology. Will refer to [place] to discuss options.

## 2018-10-30 NOTE — Telephone Encounter (Signed)
Erika Gobble, MD       10/26/18 1:54 PM  Note    Please call the patient and let her know that the biopsies and brushings of her pulmonary nodule do not show any evidence for lung cancer.   The lymph node biopsies are not back yet. I will have to call her next week when those samples are finished.   Thanks.       ___________________________________  Patients daughter was called, she stated that she is aware of results above however since the nodules are not cancerous she would like to know what they are. Please advise, thank you.

## 2018-10-30 NOTE — Telephone Encounter (Signed)
Daughter Rollene Fare called back, she states she is aware nodules were found, she is wanting to know whether they found cancer or any other disease.   Call back # [phone]

## 2018-10-30 NOTE — Telephone Encounter (Signed)
Called patients daughter, unable to reach and unable to leave message.

## 2018-10-31 ENCOUNTER — Telehealth: Payer: Self-pay | Admitting: Emergency Medicine

## 2018-10-31 NOTE — Telephone Encounter (Signed)
Patients daughter stated that the patient had a PET scan done at [facility] prior to coming to see [place] in the office. Is it ok to cancel the PET scan we ordered or does she need to go ahead and have this done.   RB please advise, thank you.

## 2018-10-31 NOTE — Telephone Encounter (Signed)
Per RB ok to cancel. Thank you.

## 2018-10-31 NOTE — Telephone Encounter (Signed)
appt cancelled

## 2018-10-31 NOTE — Telephone Encounter (Signed)
Yes thanks. I had forgotten that she already had recent PET

## 2018-11-01 ENCOUNTER — Telehealth: Payer: Self-pay | Admitting: *Deleted

## 2018-11-01 DIAGNOSIS — R9389 Abnormal findings on diagnostic imaging of other specified body structures: Secondary | ICD-10-CM

## 2018-11-01 NOTE — Telephone Encounter (Signed)
Oncology Nurse Navigator Documentation  Oncology Nurse Navigator Flowsheets 11/01/2018  Navigator Location [facility]  Navigator Encounter Type Telephone/I received referral on Mr. Erika Shah. I called and updated on appt for 11/08/2018.    Telephone Outgoing Call  Treatment Phase Pre-Tx/Tx Discussion  Barriers/Navigation Needs Coordination of Care  Interventions Coordination of Care  Coordination of Care Appts  Acuity Level 2  Time Spent with Patient 30

## 2018-11-06 ENCOUNTER — Ambulatory Visit (HOSPITAL_COMMUNITY): Payer: Medicare Other

## 2018-11-07 ENCOUNTER — Telehealth: Payer: Self-pay | Admitting: Medical Oncology

## 2018-11-07 ENCOUNTER — Telehealth: Payer: Self-pay | Admitting: Emergency Medicine

## 2018-11-07 NOTE — Telephone Encounter (Signed)
Spoke with pt. She is requesting a copy of her bronch results. These were mailed to her when first requested them 2 weeks ago. Results have been placed in the mail again. Nothing further was needed.

## 2018-11-07 NOTE — Telephone Encounter (Signed)
Son will bring pt to apt and dial in during appt.

## 2018-11-08 ENCOUNTER — Other Ambulatory Visit: Payer: Self-pay

## 2018-11-08 ENCOUNTER — Inpatient Hospital Stay: Payer: Medicare Other | Attending: Internal Medicine

## 2018-11-08 ENCOUNTER — Inpatient Hospital Stay (HOSPITAL_BASED_OUTPATIENT_CLINIC_OR_DEPARTMENT_OTHER): Payer: Medicare Other | Admitting: Internal Medicine

## 2018-11-08 ENCOUNTER — Encounter: Payer: Self-pay | Admitting: Internal Medicine

## 2018-11-08 DIAGNOSIS — I10 Essential (primary) hypertension: Secondary | ICD-10-CM | POA: Insufficient documentation

## 2018-11-08 DIAGNOSIS — R918 Other nonspecific abnormal finding of lung field: Secondary | ICD-10-CM

## 2018-11-08 DIAGNOSIS — M8580 Other specified disorders of bone density and structure, unspecified site: Secondary | ICD-10-CM | POA: Insufficient documentation

## 2018-11-08 DIAGNOSIS — Z7189 Other specified counseling: Secondary | ICD-10-CM | POA: Insufficient documentation

## 2018-11-08 DIAGNOSIS — Z5111 Encounter for antineoplastic chemotherapy: Secondary | ICD-10-CM | POA: Insufficient documentation

## 2018-11-08 DIAGNOSIS — R9389 Abnormal findings on diagnostic imaging of other specified body structures: Secondary | ICD-10-CM

## 2018-11-08 DIAGNOSIS — C3491 Malignant neoplasm of unspecified part of right bronchus or lung: Secondary | ICD-10-CM | POA: Insufficient documentation

## 2018-11-08 LAB — CBC WITH DIFFERENTIAL (CANCER CENTER ONLY)
Abs Immature Granulocytes: 0.02 10*3/uL (ref 0.00–0.07)
Basophils Absolute: 0 10*3/uL (ref 0.0–0.1)
Basophils Relative: 0 %
Eosinophils Absolute: 0.1 10*3/uL (ref 0.0–0.5)
Eosinophils Relative: 1 %
HEMATOCRIT: 40.3 % (ref 36.0–46.0)
Hemoglobin: 12.5 g/dL (ref 12.0–15.0)
Immature Granulocytes: 0 %
Lymphocytes Relative: 19 %
Lymphs Abs: 1.6 10*3/uL (ref 0.7–4.0)
MCH: 30.3 pg (ref 26.0–34.0)
MCHC: 31 g/dL (ref 30.0–36.0)
MCV: 97.6 fL (ref 80.0–100.0)
MONOS PCT: 7 %
Monocytes Absolute: 0.5 10*3/uL (ref 0.1–1.0)
Neutro Abs: 6 10*3/uL (ref 1.7–7.7)
Neutrophils Relative %: 73 %
Platelet Count: 240 10*3/uL (ref 150–400)
RBC: 4.13 MIL/uL (ref 3.87–5.11)
RDW: 13.2 % (ref 11.5–15.5)
WBC Count: 8.2 10*3/uL (ref 4.0–10.5)
nRBC: 0 % (ref 0.0–0.2)

## 2018-11-08 LAB — CMP (CANCER CENTER ONLY)
ALT: 11 U/L (ref 0–44)
AST: 16 U/L (ref 15–41)
Albumin: 3.7 g/dL (ref 3.5–5.0)
Alkaline Phosphatase: 77 U/L (ref 38–126)
Anion gap: 8 (ref 5–15)
BILIRUBIN TOTAL: 0.5 mg/dL (ref 0.3–1.2)
BUN: 8 mg/dL (ref 8–23)
CO2: 29 mmol/L (ref 22–32)
CREATININE: 0.81 mg/dL (ref 0.44–1.00)
Calcium: 9 mg/dL (ref 8.9–10.3)
Chloride: 105 mmol/L (ref 98–111)
GFR, Est AFR Am: >60 mL/min (ref >60)
Glucose, Bld: 95 mg/dL (ref 70–99)
Potassium: 3.6 mmol/L (ref 3.5–5.1)
Sodium: 142 mmol/L (ref 135–145)
Total Protein: 6.8 g/dL (ref 6.5–8.1)

## 2018-11-08 NOTE — Progress Notes (Signed)
[facility] Telephone:([phone]   [phone]  CONSULT NOTE  REFERRING PHYSICIAN: Dr. Baltazar Apo  REASON FOR CONSULTATION:  78 years old white female recently diagnosed with lung cancer.  HPI Erika Shah is a 78 y.o. female a never smoker with past medical history significant for hypertension, osteoarthritis, GERD as well as severe hearing deficit.  The patient mentions that she has been complaining of dry cough and sore throat for a while.  She had chest x-ray performed on September 12, 2018 and that showed nodular density in the right midlung.  This was followed by CT scan of the chest on September 27, 2018 and that showed multiple spiculated lesions within the lungs bilaterally the largest measured 1.4 cm.  These were suspicious for neoplasm.  The patient had a PET scan on October 04, 2018 and that showed 2.6 cm right lower lobe lesion that is hypermetabolic and consistent with primary lung neoplasm.  There was also multiple pulmonary nodules suspicious for metastatic disease.  There was also mediastinal and hilar lymph nodes and the only hypermetabolic lesion is the subcarinal node on the right side.  No findings for abdominal/pelvic metastatic disease or osseous metastatic disease. The patient was referred to Dr. Lamonte Sakai and repeat CT super D of the chest on October 22, 2018 showed a stable appearing 2.0 cm spiculated right upper lobe lung lesion and several other smaller pulmonary nodules.  There was also a stable small right hilar and subcarinal nodes and no findings for upper abdominal metastatic disease. On October 24, 2018 the patient underwent video bronchoscopy with endobronchial ultrasound and electromagnetic navigation bronchoscopy under the care of Dr. Lamonte Sakai. The final cytology ([id number]) showed malignant cells consistent with metastatic carcinoma.  The morphology of the malignant cells favoring adenocarcinoma. Dr. Lamonte Sakai kindly referred the patient  to me today for evaluation and recommendation regarding treatment of her condition. When seen today the patient is feeling fine with no concerning complaints except for mild shortness of breath with exertion.  She denied having any chest pain, cough or hemoptysis.  She denied having any recent weight loss or night sweats.  She has no nausea, vomiting, diarrhea or constipation.  She has severe hearing deficit. Family history significant for mother died at age 66 and had diabetes, father had lung cancer. The patient separated and has 6 children.  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 HPI  Past Medical History:  Diagnosis Date   GERD (gastroesophageal reflux disease)    Hypertension    Osteoarthritis    Osteopenia     Past Surgical History:  Procedure Laterality Date   APPENDECTOMY     FUDUCIAL PLACEMENT Right 10/24/2018   Procedure: PLACEMENT OF FUDUCIAL MARKERS x 3;  Surgeon: Collene Gobble, MD;  Location: [facility] OR;  Service: Thoracic;  Laterality: Right;   SHOULDER SURGERY Right    replacement   TONSILLECTOMY     VIDEO BRONCHOSCOPY WITH ENDOBRONCHIAL NAVIGATION N/A 10/24/2018   Procedure: VIDEO BRONCHOSCOPY WITH ENDOBRONCHIAL NAVIGATION;  Surgeon: Collene Gobble, MD;  Location: [facility] OR;  Service: Thoracic;  Laterality: N/A;   VIDEO BRONCHOSCOPY WITH ENDOBRONCHIAL ULTRASOUND N/A 10/24/2018   Procedure: VIDEO BRONCHOSCOPY WITH ENDOBRONCHIAL ULTRASOUND;  Surgeon: Collene Gobble, MD;  Location: [facility] OR;  Service: Thoracic;  Laterality: N/A;   WRIST SURGERY Left    fracture "has a plate in it"  No family history on file.  Social History Social History   Tobacco Use   Smoking status: Never Smoker   Smokeless tobacco: Never Used  Substance Use Topics   Alcohol use: Never    Frequency: Never   Drug use: Never    No Known Allergies  Current Outpatient Medications    Medication Sig Dispense Refill   chlorpheniramine-HYDROcodone (TUSSIONEX PENNKINETIC ER) 10-8 MG/5ML SUER Take 5 mLs by mouth at bedtime.     lisinopril-hydrochlorothiazide (PRINZIDE,ZESTORETIC) 10-12.5 MG tablet Take 1 tablet by mouth daily.     omeprazole (PRILOSEC) 20 MG capsule Take 20 mg by mouth daily.     Polyethyl Glycol-Propyl Glycol (LUBRICANT EYE DROPS) 0.4-0.3 % SOLN Place 1 drop into both eyes 3 (three) times daily as needed (dry/irritated eyes.).     No current facility-administered medications for this visit.     Review of Systems  Constitutional: positive for fatigue Eyes: negative Ears, nose, mouth, throat, and face: positive for hearing loss Respiratory: positive for dyspnea on exertion Cardiovascular: negative Gastrointestinal: negative Genitourinary:negative Integument/breast: negative Hematologic/lymphatic: negative Musculoskeletal:negative Neurological: negative Behavioral/Psych: negative Endocrine: negative Allergic/Immunologic: negative  Physical Exam  DTO:IZTIW, healthy, no distress, well nourished and well developed SKIN: skin color, texture, turgor are normal, no rashes or significant lesions HEAD: Normocephalic, No masses, lesions, tenderness or abnormalities EYES: normal, PERRLA, Conjunctiva are pink and non-injected EARS: External ears normal, Canals clear OROPHARYNX:no exudate, no erythema and lips, buccal mucosa, and tongue normal  NECK: supple, no adenopathy, no JVD LYMPH:  no palpable lymphadenopathy, no hepatosplenomegaly BREAST:not examined LUNGS: clear to auscultation , and palpation HEART: regular rate & rhythm, no murmurs and no gallops ABDOMEN:abdomen soft, non-tender, normal bowel sounds and no masses or organomegaly BACK: Back symmetric, no curvature., No CVA tenderness EXTREMITIES:no joint deformities, effusion, or inflammation, no edema  NEURO: alert & oriented x 3 with fluent speech, no focal motor/sensory  deficits  PERFORMANCE STATUS: ECOG 1  LABORATORY DATA: Lab Results  Component Value Date   WBC 7.0 10/24/2018   HGB 12.4 10/24/2018   HCT 40.0 10/24/2018   MCV 96.4 10/24/2018   PLT 241 10/24/2018      Chemistry      Component Value Date/Time   NA 140 10/24/2018 0944   K 4.1 10/24/2018 0944   CL 104 10/24/2018 0944   CO2 25 10/24/2018 0944   BUN 7 (L) 10/24/2018 0944   CREATININE 0.88 10/24/2018 0944      Component Value Date/Time   CALCIUM 9.2 10/24/2018 0944   ALKPHOS 66 10/24/2018 0944   AST 19 10/24/2018 0944   ALT 15 10/24/2018 0944   BILITOT 0.5 10/24/2018 0944       RADIOGRAPHIC STUDIES: Dg Chest Port 1 View  Result Date: 10/24/2018 CLINICAL DATA:  Status post bronchoscopy and biopsy EXAM: PORTABLE CHEST 1 VIEW COMPARISON:  Chest CT 10/22/2018 FINDINGS: There are multiple metallic markers over the right upper lung. Multiple areas of atelectasis. There is no pneumothorax. Mild cardiomegaly is unchanged. Left lung is clear. IMPRESSION: No pneumothorax. Electronically Signed   By: Ulyses Jarred M.D.   On: 10/24/2018 15:18   Ct Super D Chest Wo Contrast  Result Date: 10/23/2018 CLINICAL DATA:  Preop for endoscopic biopsy. EXAM: CT CHEST WITHOUT CONTRAST TECHNIQUE: Multidetector CT imaging of the chest was performed using thin slice collimation for electromagnetic bronchoscopy planning purposes, without intravenous contrast. COMPARISON:  Chest CT 09/27/2018 and PET-CT 10/04/2018 FINDINGS: Cardiovascular: The heart is normal in size. No pericardial effusion. Mild tortuosity  and calcification of the thoracic aorta and stable calcifications along the mitral valve annulus. Mediastinum/Nodes: Stable mediastinal and hilar lymph nodes. The esophagus is grossly normal. Lungs/Pleura: Stable 2 cm spiculated right upper lobe lung lesion on image number 41. There also several other pulmonary nodules, mainly in the right lung worrisome for metastasis. Persistent small right pleural  effusion with overlying atelectasis. Could not exclude pleural metastasis at the right lung base Upper Abdomen: No significant upper abdominal findings. No obvious adrenal gland metastasis or liver metastasis without contrast. Musculoskeletal: No significant bony findings. No lytic or sclerotic bone lesions. Stable thoracic spine hemangiomas and remote compression fracture in the midthoracic spine. IMPRESSION: 1. Stable appearing 2 cm spiculated right upper lobe lung lesion and several other smaller pulmonary nodules. 2. Stable small right hilar and subcarinal nodes. 3. No findings for upper abdominal metastatic disease. Aortic Atherosclerosis (ICD10-I70.0). Electronically Signed   By: Marijo Sanes M.D.   On: 10/23/2018 08:49   Dg C-arm Bronchoscopy  Result Date: 10/24/2018 C-ARM BRONCHOSCOPY: Fluoroscopy was utilized by the requesting physician.  No radiographic interpretation.    ASSESSMENT: This is a very pleasant 78 years old white female with highly suspicious stage IV (T1c, N2, M1 a) non-small cell lung cancer, favoring adenocarcinoma diagnosed in March 2019 and presented with right upper lobe lung nodule in addition to mediastinal lymphadenopathy and bilateral small pulmonary nodules.   PLAN: I had a lengthy discussion with the patient today about her current disease stage, prognosis and treatment options. I recommended for the patient to complete the staging work-up and she will need MRI of the brain to rule out brain metastasis. I discussed with the patient her treatment option and recommended for her to have molecular studies to rule out any actionable mutations before discussion of other treatment options.  Unfortunately the cytology from the biopsy would not be sufficient to do the molecular studies and the patient may benefit from a blood test by [facility]. We could also send the cytology from the fine-needle aspiration for PDL 1 expression. If the patient has no actionable mutations,  she could be considered for treatment with either monotherapy with Keytruda if PDL 1 expression is over 50% or a combination of chemotherapy with carboplatin, Alimta and Keytruda for 4 cycles followed by maintenance Alimta and Keytruda. The patient was also given the option of palliative care and hospice referral. She is not very interested in chemotherapy but may consider some of the other options. She was seen in the past by Dr. Hinton Rao at [facility] in [place]. I spoke to Dr. Hinton Rao and referred the patient back to her for evaluation and management of her condition.  I am sure Dr. Hinton Rao will take great care of this patient close to home. I also explained to the patient that I will be happy to see her in the future if needed. She was advised to call if she has any concerning symptoms in the interval.  The patient voices understanding of current disease status and treatment options and is in agreement with the current care plan.  All questions were answered. The patient knows to call the clinic with any problems, questions or concerns. We can certainly see the patient much sooner if necessary.  Thank you so much for allowing me to participate in the care of Erika Shah. I will continue to follow up the patient with you and assist in her care.  I spent 40 minutes counseling the patient face to  face. The total time spent in the appointment was 60 minutes.  Disclaimer: This note was dictated with voice recognition software. Similar sounding words can inadvertently be transcribed and may not be corrected upon review.   Eilleen Kempf November 08, 2018, 1:10 PM

## 2018-11-14 DIAGNOSIS — C3431 Malignant neoplasm of lower lobe, right bronchus or lung: Secondary | ICD-10-CM | POA: Diagnosis not present

## 2018-11-30 ENCOUNTER — Ambulatory Visit: Payer: Medicare Other | Admitting: Emergency Medicine

## 2018-12-06 LAB — ACID FAST CULTURE WITH REFLEXED SENSITIVITIES (MYCOBACTERIA): Acid Fast Culture: NEGATIVE

## 2019-01-23 DIAGNOSIS — C3431 Malignant neoplasm of lower lobe, right bronchus or lung: Secondary | ICD-10-CM | POA: Diagnosis not present

## 2019-03-06 DIAGNOSIS — C3431 Malignant neoplasm of lower lobe, right bronchus or lung: Secondary | ICD-10-CM | POA: Diagnosis not present

## 2019-04-24 DIAGNOSIS — C3431 Malignant neoplasm of lower lobe, right bronchus or lung: Secondary | ICD-10-CM | POA: Diagnosis not present

## 2019-06-05 DIAGNOSIS — C3431 Malignant neoplasm of lower lobe, right bronchus or lung: Secondary | ICD-10-CM

## 2019-09-13 DIAGNOSIS — C3431 Malignant neoplasm of lower lobe, right bronchus or lung: Secondary | ICD-10-CM

## 2019-09-27 DIAGNOSIS — C3431 Malignant neoplasm of lower lobe, right bronchus or lung: Secondary | ICD-10-CM | POA: Diagnosis not present

## 2019-10-25 DIAGNOSIS — C3431 Malignant neoplasm of lower lobe, right bronchus or lung: Secondary | ICD-10-CM

## 2019-12-13 DIAGNOSIS — Z0001 Encounter for general adult medical examination with abnormal findings: Secondary | ICD-10-CM

## 2020-01-10 DIAGNOSIS — C3431 Malignant neoplasm of lower lobe, right bronchus or lung: Secondary | ICD-10-CM

## 2020-02-03 DIAGNOSIS — S32000A Wedge compression fracture of unspecified lumbar vertebra, initial encounter for closed fracture: Secondary | ICD-10-CM

## 2020-03-06 DIAGNOSIS — C3431 Malignant neoplasm of lower lobe, right bronchus or lung: Secondary | ICD-10-CM

## 2020-03-28 ENCOUNTER — Inpatient Hospital Stay
Admission: AD | Admit: 2020-03-28 | Payer: Medicare Other | Source: Other Acute Inpatient Hospital | Admitting: Pulmonary Disease

## 2020-03-29 DIAGNOSIS — I361 Nonrheumatic tricuspid (valve) insufficiency: Secondary | ICD-10-CM

## 2020-03-29 DIAGNOSIS — C3431 Malignant neoplasm of lower lobe, right bronchus or lung: Secondary | ICD-10-CM | POA: Diagnosis not present

## 2020-03-29 DIAGNOSIS — J90 Pleural effusion, not elsewhere classified: Secondary | ICD-10-CM

## 2020-03-29 DIAGNOSIS — I340 Nonrheumatic mitral (valve) insufficiency: Secondary | ICD-10-CM | POA: Diagnosis not present

## 2020-03-30 DIAGNOSIS — C3431 Malignant neoplasm of lower lobe, right bronchus or lung: Secondary | ICD-10-CM | POA: Diagnosis not present

## 2020-04-01 DIAGNOSIS — C3431 Malignant neoplasm of lower lobe, right bronchus or lung: Secondary | ICD-10-CM

## 2020-04-02 DIAGNOSIS — C3431 Malignant neoplasm of lower lobe, right bronchus or lung: Secondary | ICD-10-CM

## 2020-04-03 DIAGNOSIS — C3431 Malignant neoplasm of lower lobe, right bronchus or lung: Secondary | ICD-10-CM

## 2020-04-06 IMAGING — CT CT SUPER D CHEST WITHOUT CONTRAST
2 of 4 series · 15 of 36 positions shown, 18 images · non-contrast
Comparison: Chest CT 09/27/2018 and PET-CT 10/04/2018

CLINICAL DATA: Preop for endoscopic biopsy.

EXAM:
CT CHEST WITHOUT CONTRAST
TECHNIQUE: Multidetector CT imaging of the chest was performed using thin slice
collimation for electromagnetic bronchoscopy planning purposes,
without intravenous contrast.

[Series 4: thins · axial · 0.63mm/px · z∈[-75,+184]mm · 12 of 363 slices shown, 15 images]
[im 20/363  mediastinal]
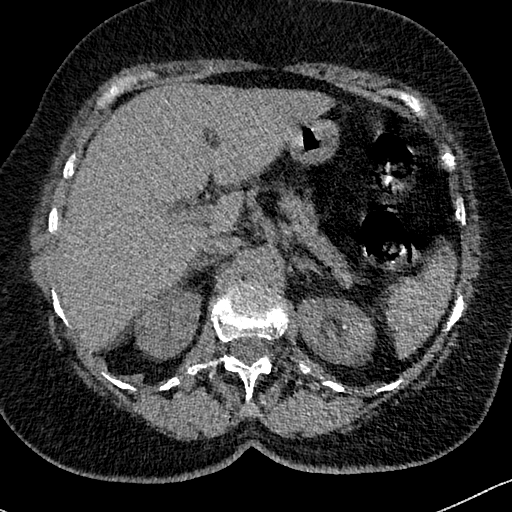
[im 20/363  lung]
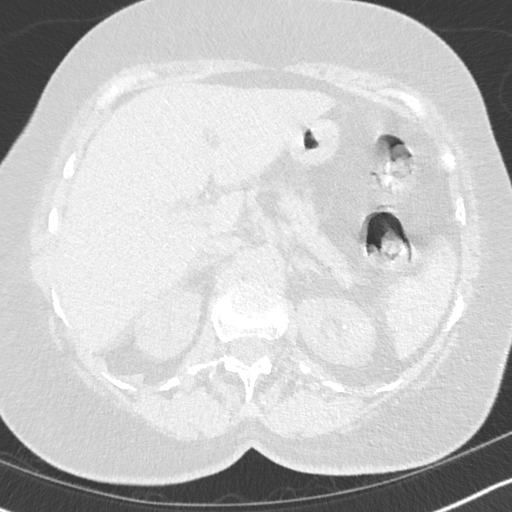
[im 58/363  lung]
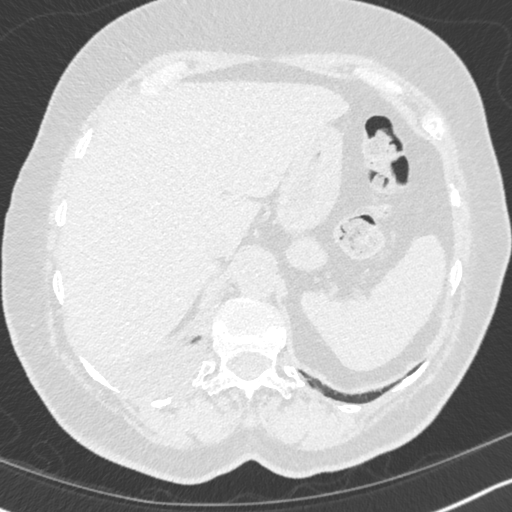
[im 77/363  lung]
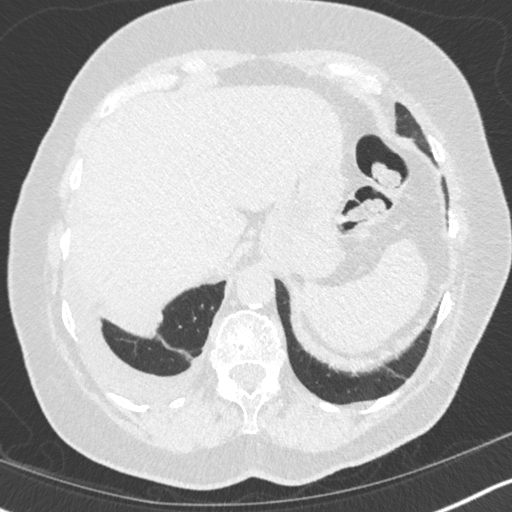
[im 115/363  lung]
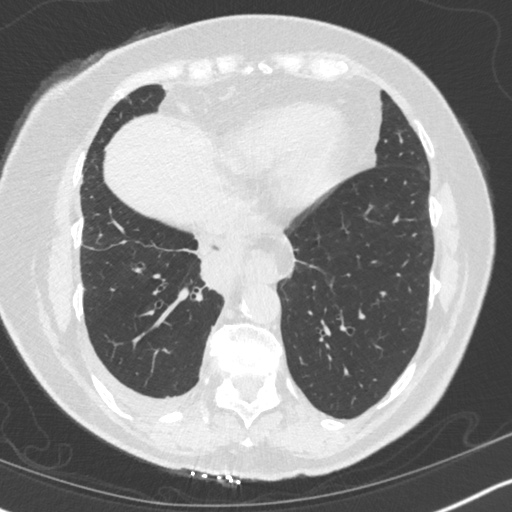
[im 134/363  mediastinal]
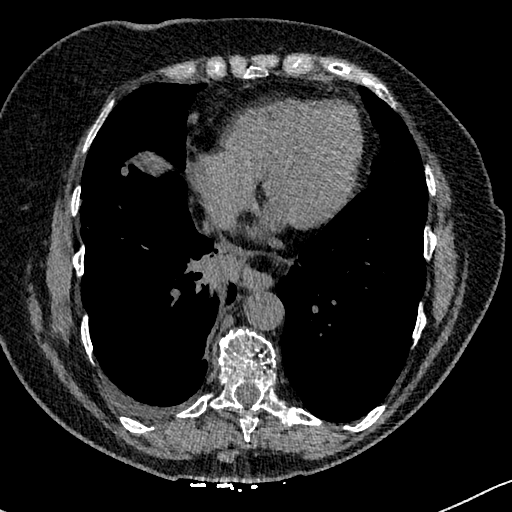
[im 134/363  lung]
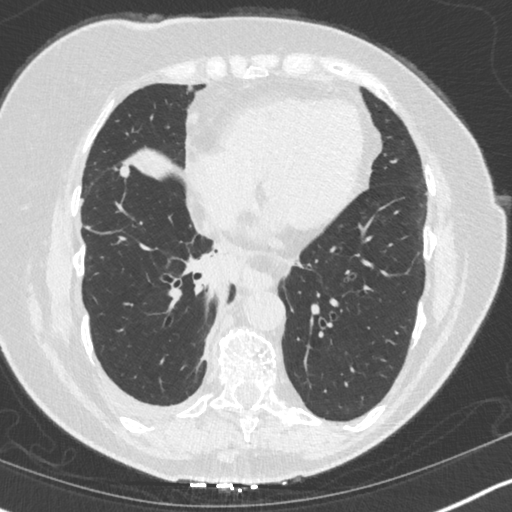
[im 172/363  lung]
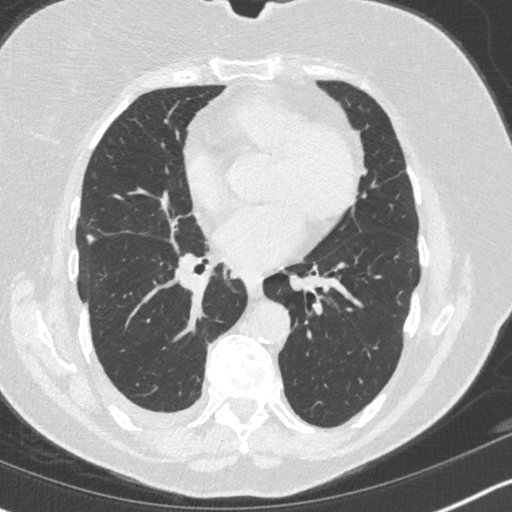
[im 191/363  lung]
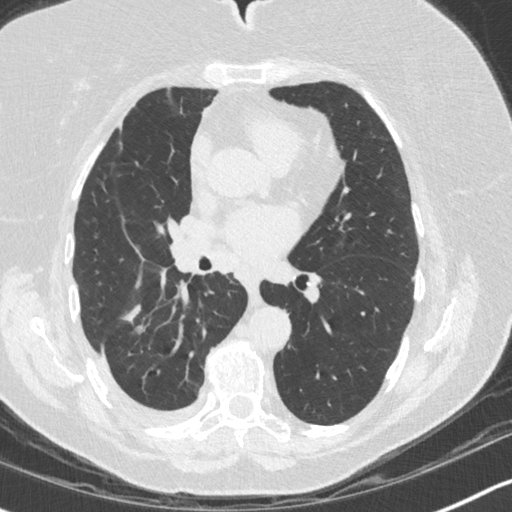
[im 229/363  lung]
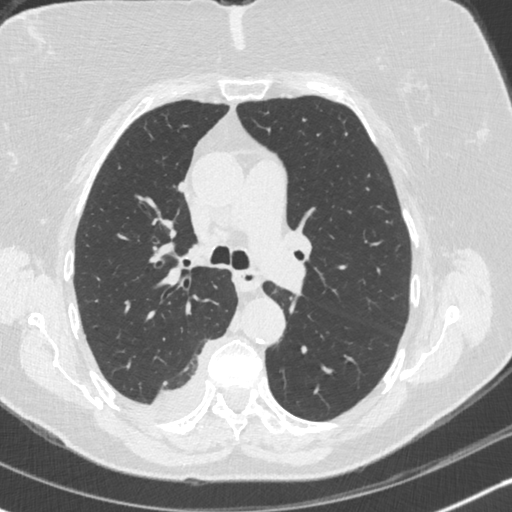
[im 248/363  mediastinal]
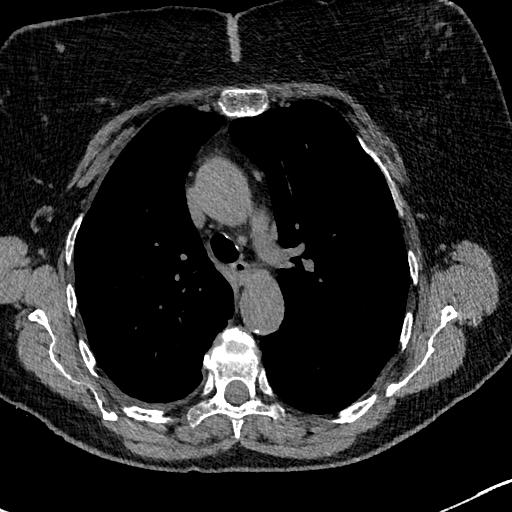
[im 248/363  lung]
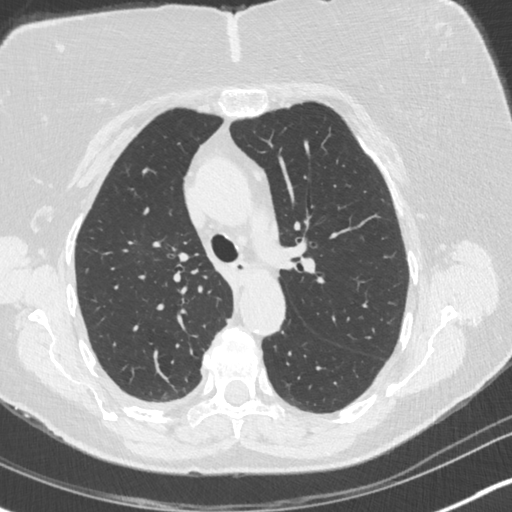
[im 286/363  lung]
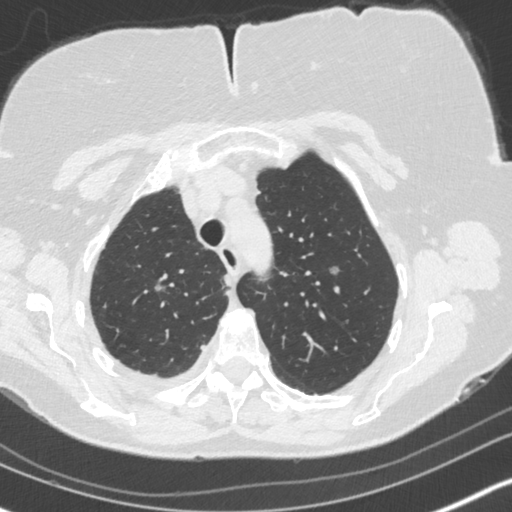
[im 305/363  lung]
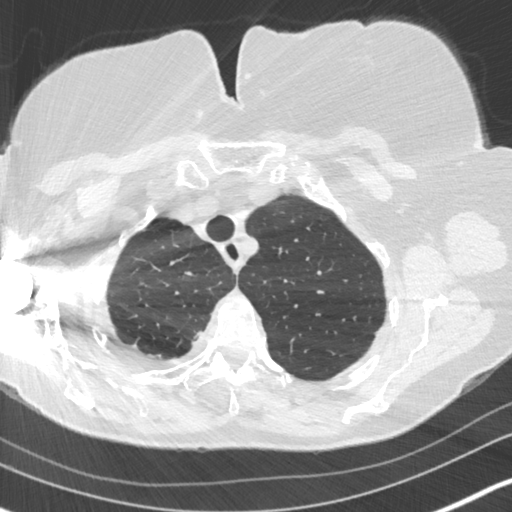
[im 343/363  lung]
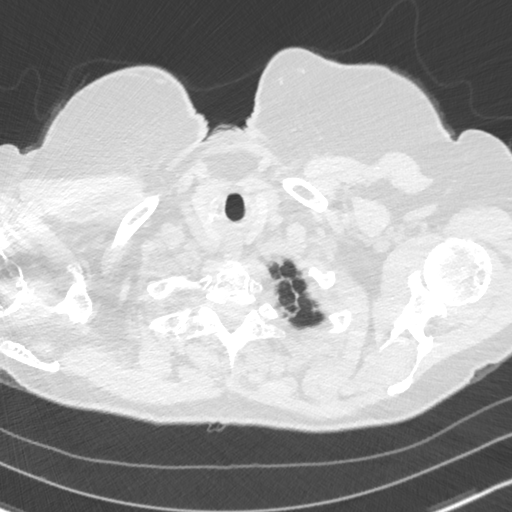

[Series 5: coronal · coronal · 0.59mm/px · 3 of 101 slices shown]
[im 21/101  lung]
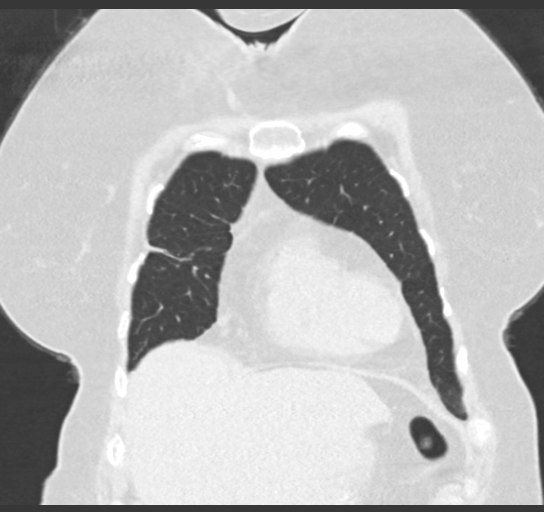
[im 41/101  lung]
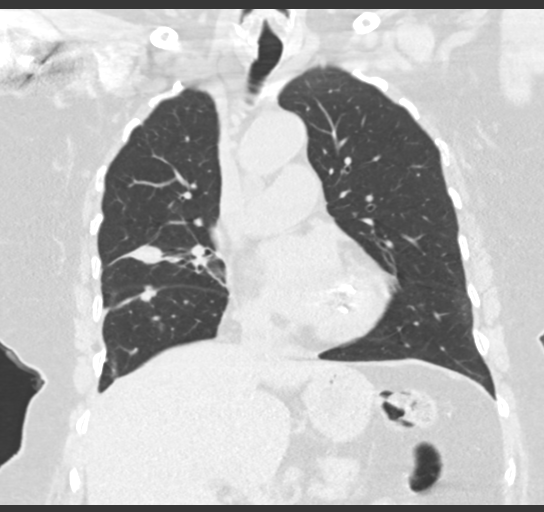
[im 61/101  lung]
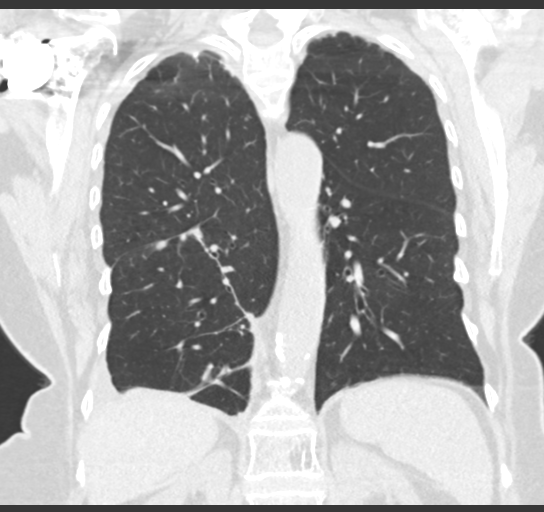

[15 of 36 positions shown; findings below may reference images not displayed]

FINDINGS: Cardiovascular: The heart is normal in size. No pericardial
effusion. Mild tortuosity and calcification of the thoracic aorta
and stable calcifications along the mitral valve annulus.

Mediastinum/Nodes: Stable mediastinal and hilar lymph nodes. The
esophagus is grossly normal.

Lungs/Pleura: Stable 2 cm spiculated right upper lobe lung lesion on
image number 41. There also several other pulmonary nodules, mainly
in the right lung worrisome for metastasis.

Persistent small right pleural effusion with overlying atelectasis.
Could not exclude pleural metastasis at the right lung base

Upper Abdomen: No significant upper abdominal findings. No obvious
adrenal gland metastasis or liver metastasis without contrast.

Musculoskeletal: No significant bony findings. No lytic or sclerotic
bone lesions. Stable thoracic spine hemangiomas and remote
compression fracture in the midthoracic spine.
IMPRESSION: 1. Stable appearing 2 cm spiculated right upper lobe lung lesion and
several other smaller pulmonary nodules.
2. Stable small right hilar and subcarinal nodes.
3. No findings for upper abdominal metastatic disease.

Aortic Atherosclerosis ([id number]).

## 2020-04-28 DIAGNOSIS — C3431 Malignant neoplasm of lower lobe, right bronchus or lung: Secondary | ICD-10-CM | POA: Diagnosis not present

## 2020-05-01 ENCOUNTER — Encounter: Payer: Self-pay | Admitting: Oncology

## 2020-05-10 ENCOUNTER — Encounter: Payer: Self-pay | Admitting: Pharmacist

## 2020-05-10 DIAGNOSIS — C3431 Malignant neoplasm of lower lobe, right bronchus or lung: Secondary | ICD-10-CM | POA: Insufficient documentation

## 2020-05-12 ENCOUNTER — Encounter: Payer: Self-pay | Admitting: Pharmacist

## 2020-05-17 ENCOUNTER — Encounter: Payer: Self-pay | Admitting: Pharmacist

## 2020-05-26 ENCOUNTER — Encounter: Payer: Self-pay | Admitting: Pharmacist

## 2020-06-15 DEATH — deceased
